# Patient Record
Sex: Female | Born: 1960 | Race: White | Hispanic: No | Smoking: Former smoker
Health system: Southern US, Community
[De-identification: ages and names within clinical notes are randomized; demographics above are authoritative.]

## PROBLEM LIST (undated history)

## (undated) DIAGNOSIS — N200 Calculus of kidney: Secondary | ICD-10-CM

## (undated) DIAGNOSIS — E079 Disorder of thyroid, unspecified: Secondary | ICD-10-CM

## (undated) HISTORY — PX: TUBAL LIGATION: SHX77

## (undated) HISTORY — PX: WISDOM TOOTH EXTRACTION: SHX21

## (undated) HISTORY — DX: Calculus of kidney: N20.0

## (undated) HISTORY — DX: Disorder of thyroid, unspecified: E07.9

---

## 2017-08-19 ENCOUNTER — Ambulatory Visit (INDEPENDENT_AMBULATORY_CARE_PROVIDER_SITE_OTHER): Payer: Managed Care, Other (non HMO) | Admitting: Orthopaedic Surgery

## 2017-08-19 ENCOUNTER — Encounter (INDEPENDENT_AMBULATORY_CARE_PROVIDER_SITE_OTHER): Payer: Self-pay | Admitting: Orthopaedic Surgery

## 2017-08-19 ENCOUNTER — Ambulatory Visit (INDEPENDENT_AMBULATORY_CARE_PROVIDER_SITE_OTHER): Payer: Managed Care, Other (non HMO)

## 2017-08-19 VITALS — BP 122/83 | HR 73 | Resp 18 | Ht 63.0 in | Wt 175.0 lb

## 2017-08-19 DIAGNOSIS — M25562 Pain in left knee: Secondary | ICD-10-CM | POA: Diagnosis not present

## 2017-08-19 NOTE — Progress Notes (Signed)
Office Visit Note   Patient: Terry Sims           Date of Birth: 1960/06/23           MRN: [id number] Visit Date: 08/19/2017              Requested by: No referring provider defined for this encounter. PCP: Alton Revere, MD   Assessment & Plan: Visit Diagnoses:  1. Acute pain of left knee     Plan: Most likely cause of knee pain is mild osteoarthritis per plain films.  Could have some other internal derangement but symptoms are more consistent with the arthritis.  Long discussion regarding different treatment options including cortisone, NSAIDs, exercises. MRI scan would be definitive diagnostic study.  Would suggest that if her pain does not resolve over the next several weeks and she is significantly.  Compromised.  Follow-Up Instructions: Return if symptoms worsen or fail to improve.   Orders:  Orders Placed This Encounter  Procedures  . XR KNEE 3 VIEW LEFT   No orders of the defined types were placed in this encounter.     Procedures: No procedures performed   Clinical Data: No additional findings.   Subjective: Chief Complaint  Patient presents with  . Left Knee - Pain  . New Patient (Initial Visit)    L knee pain for 3 months getting worse, 2 days ago pt was walking in to play volleyball and felt a snap.   Mrs. Terry Sims is 57 years old and very active playing volleyball up to 3 times a week.  She has not had any injury or trauma but several days ago had an acute onset of knee pain to the point where she could not play.  She did not have a specific event.  She was having some catching particularly along the anterior aspect of her knee.  Possibly some swelling.  No locking.  No hip or back pain.  No numbness or tingling.  Tried some heat that made a difference.  Still having some pain but much better over the last 48 hours.  HPI  Review of Systems  Constitutional: Negative for fatigue and fever.  HENT: Negative for ear pain.   Eyes: Negative for pain.    Respiratory: Negative for cough and shortness of breath.   Cardiovascular: Positive for leg swelling.  Gastrointestinal: Negative for constipation and diarrhea.  Genitourinary: Negative for difficulty urinating.  Musculoskeletal: Negative for back pain and neck pain.  Skin: Negative for rash.  Allergic/Immunologic: Negative for food allergies.  Neurological: Positive for weakness. Negative for numbness.  Hematological: Does not bruise/bleed easily.  Psychiatric/Behavioral: Negative for sleep disturbance.     Objective: Vital Signs: BP 122/83 (BP Location: Left Arm, Patient Position: Sitting, Cuff Size: Normal)   Pulse 73   Resp 18   Ht  (1.6 m)   Wt 175 lb (79.4 kg)   BMI 31.00 kg/m   Physical Exam  Constitutional: She is oriented to person, place, and time. She appears well-developed and well-nourished.  HENT:  Mouth/Throat: Oropharynx is clear and moist.  Eyes: Pupils are equal, round, and reactive to light. EOM are normal.  Pulmonary/Chest: Effort normal.  Neurological: She is alert and oriented to person, place, and time.  Skin: Skin is warm and dry.  Psychiatric: She has a normal mood and affect. Her behavior is normal.    Ortho Exam awake alert and oriented x3.  Comfortable sitting.  Left knee without effusion.  No specific medial  or lateral joint pain.  No instability.  Positive patellar crepitation with some pain with patella compression.  No skin changes.  No erythema or ecchymosis.  No popliteal pain.  No calf pain.  No distal edema.  Straight leg raise negative.  Painless range of motion hip.  Specific pain along the posterior medial posterior lateral corner.  No popping or catching other than beneath the patella  Specialty Comments:  No specialty comments available.  Imaging: Xr Knee 3 View Left  Result Date: 08/19/2017 Films of the left knee were obtained in 3 projections standing.  No ectopic calcification.  Mild tricompartmental degenerative changes  greatest spurring about the patellofemoral joint.  The joint still maintained.  Films are consistent with mild osteoarthritis.  No acute changes    PMFS History: There are no active problems to display for this patient.  Past Medical History:  Diagnosis Date  . Kidney stones   . Thyroid disease     History reviewed. No pertinent family history.  Past Surgical History:  Procedure Laterality Date  . CESAREAN SECTION    . TUBAL LIGATION    . WISDOM TOOTH EXTRACTION     Social History   Occupational History  . Not on file  Tobacco Use  . Smoking status: Former Smoker    Last attempt to quit: 1984    Years since quitting: 35.3  . Smokeless tobacco: Never Used  Substance and Sexual Activity  . Alcohol use: Not Currently  . Drug use: Not Currently  . Sexual activity: Not on file

## 2019-02-16 ENCOUNTER — Other Ambulatory Visit (HOSPITAL_COMMUNITY): Payer: Self-pay | Admitting: Urology

## 2019-02-16 DIAGNOSIS — N130 Hydronephrosis with ureteropelvic junction obstruction: Secondary | ICD-10-CM

## 2019-02-27 ENCOUNTER — Other Ambulatory Visit: Payer: Self-pay

## 2019-02-27 ENCOUNTER — Encounter (HOSPITAL_COMMUNITY)
Admission: RE | Admit: 2019-02-27 | Discharge: 2019-02-27 | Disposition: A | Discharge status: Home / Self Care | Payer: BC Managed Care – PPO | Source: Ambulatory Visit | Attending: Urology | Admitting: Urology

## 2019-02-27 DIAGNOSIS — N130 Hydronephrosis with ureteropelvic junction obstruction: Secondary | ICD-10-CM

## 2019-02-27 IMAGING — NM NM RENAL IMAGING FLOW W/ PHARM
4 series · 14 of 14 positions shown · non-contrast
Comparison: None

Imaging correlation: None

CLINICAL DATA: Hydronephrosis, UPJ obstruction, kidney stones, LEFT
flank pain

EXAM:
NUCLEAR MEDICINE RENAL SCAN WITH DIURETIC ADMINISTRATION
TECHNIQUE: Radionuclide angiographic and sequential renal images were obtained
after intravenous injection of radiopharmaceutical. Imaging was
continued during slow intravenous injection of Lasix approximately
15 minutes after the start of the examination.
RADIOPHARMACEUTICALS:  5.0 mCi Gechnetium-66m MAG3 IV
Pharmaceutical: Lasix 40 mg IV

[Series 1: renal scan · 4.14mm/px · 6 of 61 frames shown (1 of 2)]
[frame 6/61]
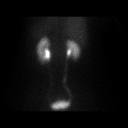
[frame 16/61]
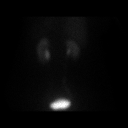
[frame 26/61]
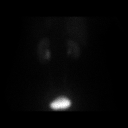
[frame 36/61]
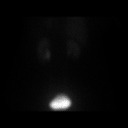
[frame 46/61]
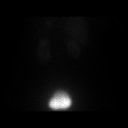
[frame 56/61]
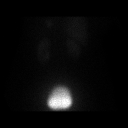

[Series 1: renal scan · 4.14mm/px · 6 of 40 frames shown (2 of 2)]
[frame 4/40  full-range]
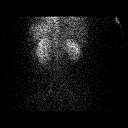
[frame 10/40  full-range]
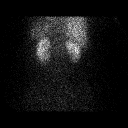
[frame 17/40  full-range]
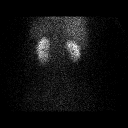
[frame 24/40  full-range]
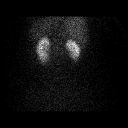
[frame 30/40  full-range]
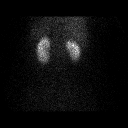
[frame 37/40  full-range]
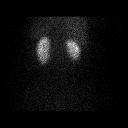

[Series 2: pre void · non-contrast · 2.07mm/px · 1 of 1 slices shown]
[im 1/1]
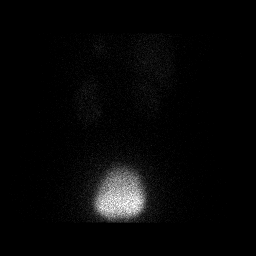

[Series 3: post void · 2.07mm/px · 1 of 1 slices shown]
[im 1/1  full-range]
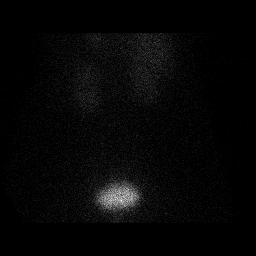

[14 of 14 positions shown; findings below may reference images not displayed]

FINDINGS: Flow: Prompt symmetric arterial flow to the kidneys. LEFT kidney
appears mildly larger than RIGHT.

Left renogram: Normal uptake, concentration and excretion of tracer
by LEFT kidney. Good washout of tracer before and continuing after
Lasix administration. No collecting system dilatation seen. No
significant residual tracer at the conclusion of the exam. Analysis
of the renogram curve demonstrates a normal time to peak activity of
3.2 minutes with a fall to half maximum activity 4.1 minutes later.

Right renogram: Normal uptake, concentration and excretion of tracer
by RIGHT kidney. Good clearance of tracer by RIGHT kidney prior to
and continuing following Lasix administration. No collecting system
dilatation seen. No significant residual tracer at conclusion of the
exam. Analysis of the renogram curve demonstrates a normal time to
peak activity of 2.7 minutes with fall to half maximum activity
minutes later.

Differential:

Left kidney = 56 %

Right kidney = 44 %

T1/2 post Lasix :

Left kidney = N/A min, tracer predominantly cleared prior to Lasix

Right kidney = N/A min, tracer predominately cleared prior to Lasix
IMPRESSION: Normal exam.

## 2019-02-27 MED ORDER — FUROSEMIDE 10 MG/ML IJ SOLN
INTRAMUSCULAR | Status: AC
  Filled 2019-02-27: qty 4

## 2019-02-27 MED ORDER — FUROSEMIDE 10 MG/ML IJ SOLN
40.0000 mg | Freq: Once | INTRAMUSCULAR | Status: AC
  Administered 2019-02-27: 40 mg via INTRAVENOUS

## 2019-02-27 MED ORDER — TECHNETIUM TC 99M MERTIATIDE: 5.0000 | Freq: Once | INTRAVENOUS | Status: DC | PRN

## 2020-12-16 LAB — TSH: TSH: 2.02 (ref 0.41–5.90)

## 2021-05-27 ENCOUNTER — Encounter: Payer: Self-pay | Admitting: Nurse Practitioner

## 2021-05-27 ENCOUNTER — Ambulatory Visit (INDEPENDENT_AMBULATORY_CARE_PROVIDER_SITE_OTHER): Payer: BLUE CROSS/BLUE SHIELD | Admitting: Nurse Practitioner

## 2021-05-27 VITALS — BP 111/76 | HR 70 | Ht 62.5 in | Wt 170.0 lb

## 2021-05-27 DIAGNOSIS — E063 Autoimmune thyroiditis: Secondary | ICD-10-CM

## 2021-05-27 DIAGNOSIS — E038 Other specified hypothyroidism: Secondary | ICD-10-CM | POA: Diagnosis not present

## 2021-05-27 MED ORDER — LEVOTHYROXINE SODIUM 112 MCG PO TABS: 112.0000 ug | ORAL_TABLET | Freq: Every day | ORAL | 0 refills | Status: DC

## 2021-05-27 NOTE — Patient Instructions (Signed)

## 2021-05-27 NOTE — Progress Notes (Signed)
Endocrinology Consult Note                                         05/27/2021, 8:54 AM  Subjective:   Subjective    Terry Sims is a 61 y.o.-year-old female patient being seen in consultation for hypothyroidism referred by Buddy Duty, NP.   Past Medical History:  Diagnosis Date   Kidney stones    Thyroid disease     Past Surgical History:  Procedure Laterality Date   CESAREAN SECTION     TUBAL LIGATION     WISDOM TOOTH EXTRACTION      Social History   Socioeconomic History   Marital status: Divorced    Spouse name: Not on file   Number of children: Not on file   Years of education: Not on file   Highest education level: Not on file  Occupational History   Not on file  Tobacco Use   Smoking status: Former    Types: Cigarettes    Quit date: 55    Years since quitting: 39.1    Passive exposure: Past   Smokeless tobacco: Never  Vaping Use   Vaping Use: Never used  Substance and Sexual Activity   Alcohol use: Not Currently   Drug use: Not Currently   Sexual activity: Not on file  Other Topics Concern   Not on file  Social History Narrative   Not on file   Social Determinants of Health   Financial Resource Strain: Not on file  Food Insecurity: Not on file  Transportation Needs: Not on file  Physical Activity: Not on file  Stress: Not on file  Social Connections: Not on file    Family History  Problem Relation Age of Onset   Cancer Mother    Hypertension Mother    Thyroid disease Mother    Osteoporosis Mother     Outpatient Encounter Medications as of 05/27/2021  Medication Sig   buPROPion (WELLBUTRIN XL) 300 MG 24 hr tablet Take 300 mg by mouth daily.   levothyroxine (SYNTHROID) 112 MCG tablet Take 1 tablet (112 mcg total) by mouth daily.   Vitamin D, Ergocalciferol, (DRISDOL) 1.25 MG (50000 UNIT) CAPS capsule Take 50,000 Units by mouth once a week.   [DISCONTINUED] thyroid  (ARMOUR) 90 MG tablet NP Thyroid 90 mg tablet  TAKE 1 TABLET BY MOUTH BEFORE OTHER MEDS OR MEALS   [DISCONTINUED] levothyroxine (SYNTHROID) 75 MCG tablet Take by mouth. (Patient not taking: Reported on 05/27/2021)   No facility-administered encounter medications on file as of 05/27/2021.    ALLERGIES: Allergies  Allergen Reactions   Codeine Nausea Only   VACCINATION STATUS:  There is no immunization history on file for this patient.   HPI   Terry Sims  is a patient with the above medical history. she was diagnosed with hypothyroidism at approximate age of 44 years, which required subsequent initiation of thyroid hormone replacement. she was given various doses of NP Thyroid and Levothyroxine over the years, currently on  90 mg of the NP thyroid. she reports compliance to this medication:  Taking it daily on empty stomach, however she does take it with her morning coffee.   I reviewed patient's thyroid tests:  Lab Results  Component Value Date   TSH 2.02 12/16/2020     Pt describes: - weight gain- reports 20+ lb weight gain over last 3 months - fatigue - depression - constipation - nail abnormality  Pt denies feeling nodules in neck, hoarseness, dysphagia/odynophagia, SOB with lying down.  she does have family history of thyroid disorders in her mom and sisters.  No family history of thyroid cancer.  No history of radiation therapy to head or neck.  No recent use of iodine supplements.  Denies use of Biotin containing supplements.  I reviewed her chart and she also has a history of depression and OA.   ROS:  Constitutional: + weight gain, + fatigue, no subjective hyperthermia, no subjective hypothermia Eyes: no blurry vision, no xerophthalmia ENT: no sore throat, no nodules palpated in throat, no dysphagia/odynophagia, no hoarseness Cardiovascular: no chest pain, no SOB, no palpitations, no leg swelling Respiratory: no cough, no SOB Gastrointestinal: no  nausea/vomiting/diarrhea, + constipation Musculoskeletal: no muscle/joint aches Skin: no rashes, + nail ridges Neurological: no tremors, no numbness, no tingling, no dizziness Psychiatric: + depression, no anxiety   Objective:   Objective     BP 111/76    Pulse 70    Ht 5' 2.5" (1.588 m)    Wt 170 lb (77.1 kg)    SpO2 97%    BMI 30.60 kg/m  Wt Readings from Last 3 Encounters:  05/27/21 170 lb (77.1 kg)  08/19/17 175 lb (79.4 kg)    BP Readings from Last 3 Encounters:  05/27/21 111/76  08/19/17 122/83     Constitutional:  Body mass index is 30.6 kg/m., not in acute distress, normal state of mind Eyes: PERRLA, EOMI, no exophthalmos ENT: moist mucous membranes, no thyromegaly, no cervical lymphadenopathy Cardiovascular: normal precordial activity, RRR, no murmur/rubs/gallops Respiratory:  adequate breathing efforts, no gross chest deformity, Clear to auscultation bilaterally Gastrointestinal: abdomen soft, non-tender, no distension, bowel sounds present Musculoskeletal: no gross deformities, strength intact in all four extremities Skin: moist, warm, no rashes, + vertical nail ridges bilaterally Neurological: mild tremor with outstretched hands, deep tendon reflexes normal in BLE.   CMP ( most recent) CMP  No results found for: NA, K, CL, CO2, GLUCOSE, BUN, CREATININE, CALCIUM, PROT, ALBUMIN, AST, ALT, ALKPHOS, BILITOT, GFRNONAA, GFRAA   Diabetic Labs (most recent): No results found for: HGBA1C   Lipid Panel ( most recent) Lipid Panel  No results found for: CHOL, TRIG, HDL, CHOLHDL, VLDL, LDLCALC, LDLDIRECT, LABVLDL     Lab Results  Component Value Date   TSH 2.02 12/16/2020      Assessment & Plan:   ASSESSMENT / PLAN:  1. Hypothyroidism- r/t Hashimoto's thyroiditis   Patient with long-standing hypothyroidism, on thyroid hormone replacement therapy with NP thyroid. On physical exam, patient  does not have gross goiter, thyroid nodules, or neck compression  symptoms.  I discussed switching patient back to synthetic hormone for better control and she agreed.  I discussed and initiated her on Levothyroxine 112 mcg po daily before breakfast.  - We discussed about correct intake of levothyroxine, at fasting, with water, separated by at least 30 minutes from breakfast, and separated by more than 4 hours from calcium, iron, multivitamins, acid reflux medications (PPIs). -Patient is made aware of the fact that  thyroid hormone replacement is needed for life, dose to be adjusted by periodic monitoring of thyroid function tests.  - Will check thyroid tests today for baseline: TSH, Free T4, Free T3, and will repeat TSH and FT4 prior to next appt to check status after switching to synthetic hormone.  -Due to absence of clinical goiter, no need for thyroid ultrasound.  -She also mentioned wanting her reproductive hormones checked, I encouraged her to reach out to her OBGYN for this testing as this is their area of expertise.  - Time spent with the patient: 52 minutes, of which >50% was spent in obtaining information about her symptoms, reviewing her previous labs, evaluations, and treatments, counseling her about her hypothyroidism, and developing a plan to confirm the diagnosis and long term treatment as necessary. Please refer to "Patient Self Inventory" in the Media tab for reviewed elements of pertinent patient history.  Sarann O'Dell participated in the discussions, expressed understanding, and voiced agreement with the above plans.  All questions were answered to her satisfaction. she is encouraged to contact clinic should she have any questions or concerns prior to her return visit.   FOLLOW UP PLAN:  Return in about 2 months (around 07/25/2021) for Thyroid follow up, Previsit labs- labs today and again before next visit in 8 weeks.  Ronny Bacon, [facility] [address] [place], [place] [address] Phone:  [phone] Fax: [phone]  05/27/2021, 8:54 AM

## 2021-05-28 LAB — T4, FREE: Free T4: 0.83 ng/dL (ref 0.82–1.77)

## 2021-05-28 LAB — T3, FREE: T3, Free: 2.9 pg/mL (ref 2.0–4.4)

## 2021-05-28 LAB — TSH: TSH: 0.637 u[IU]/mL (ref 0.450–4.500)

## 2021-07-17 LAB — TSH: TSH: 0.055 u[IU]/mL — ABNORMAL LOW (ref 0.450–4.500)

## 2021-07-17 LAB — T4, FREE: Free T4: 1.77 ng/dL (ref 0.82–1.77)

## 2021-07-25 ENCOUNTER — Ambulatory Visit: Payer: BLUE CROSS/BLUE SHIELD | Admitting: Nurse Practitioner

## 2021-07-29 NOTE — Patient Instructions (Signed)

## 2021-07-30 ENCOUNTER — Encounter: Payer: Self-pay | Admitting: Nurse Practitioner

## 2021-07-30 ENCOUNTER — Ambulatory Visit (INDEPENDENT_AMBULATORY_CARE_PROVIDER_SITE_OTHER): Payer: BLUE CROSS/BLUE SHIELD | Admitting: Nurse Practitioner

## 2021-07-30 VITALS — BP 92/66 | HR 63 | Ht 62.5 in | Wt 172.0 lb

## 2021-07-30 DIAGNOSIS — E063 Autoimmune thyroiditis: Secondary | ICD-10-CM | POA: Diagnosis not present

## 2021-07-30 DIAGNOSIS — E038 Other specified hypothyroidism: Secondary | ICD-10-CM

## 2021-07-30 MED ORDER — LEVOTHYROXINE SODIUM 112 MCG PO TABS: 112.0000 ug | ORAL_TABLET | Freq: Every day | ORAL | 1 refills | Status: AC

## 2021-07-30 MED ORDER — BUPROPION HCL ER (XL) 300 MG PO TB24: 300.0000 mg | ORAL_TABLET | Freq: Every day | ORAL | 3 refills | Status: AC

## 2021-07-30 NOTE — Progress Notes (Signed)
?                                   ?                                Endocrinology Follow Up Note  ?                                       07/30/2021, 9:07 AM ? ?Subjective:  ? ?Subjective   ? ?Terry Sims is a 61 y.o.-year-old female patient being seen in follow up after being seen in consultation for hypothyroidism referred by Buddy Duty, NP. ? ? ?Past Medical History:  ?Diagnosis Date  ? Kidney stones   ? Thyroid disease   ? ? ?Past Surgical History:  ?Procedure Laterality Date  ? CESAREAN SECTION    ? TUBAL LIGATION    ? WISDOM TOOTH EXTRACTION    ? ? ?Social History  ? ?Socioeconomic History  ? Marital status: Divorced  ?  Spouse name: Not on file  ? Number of children: Not on file  ? Years of education: Not on file  ? Highest education level: Not on file  ?Occupational History  ? Not on file  ?Tobacco Use  ? Smoking status: Former  ?  Types: Cigarettes  ?  Quit date: 47  ?  Years since quitting: 39.3  ?  Passive exposure: Past  ? Smokeless tobacco: Never  ?Vaping Use  ? Vaping Use: Never used  ?Substance and Sexual Activity  ? Alcohol use: Not Currently  ? Drug use: Not Currently  ? Sexual activity: Not on file  ?Other Topics Concern  ? Not on file  ?Social History Narrative  ? Not on file  ? ?Social Determinants of Health  ? ?Financial Resource Strain: Not on file  ?Food Insecurity: Not on file  ?Transportation Needs: Not on file  ?Physical Activity: Not on file  ?Stress: Not on file  ?Social Connections: Not on file  ? ? ?Family History  ?Problem Relation Age of Onset  ? Cancer Mother   ? Hypertension Mother   ? Thyroid disease Mother   ? Osteoporosis Mother   ? ? ?Outpatient Encounter Medications as of 07/30/2021  ?Medication Sig  ? Vitamin D, Ergocalciferol, (DRISDOL) 1.25 MG (50000 UNIT) CAPS capsule Take 50,000 Units by mouth once a week.  ? [DISCONTINUED] buPROPion (WELLBUTRIN XL) 300 MG 24 hr tablet Take 300 mg by mouth daily.  ? [DISCONTINUED] levothyroxine (SYNTHROID) 112 MCG tablet Take 1  tablet (112 mcg total) by mouth daily.  ? buPROPion (WELLBUTRIN XL) 300 MG 24 hr tablet Take 1 tablet (300 mg total) by mouth daily.  ? levothyroxine (SYNTHROID) 112 MCG tablet Take 1 tablet (112 mcg total) by mouth daily.  ? ?No facility-administered encounter medications on file as of 07/30/2021.  ? ? ?ALLERGIES: ?Allergies  ?Allergen Reactions  ? Codeine Nausea Only  ? ?VACCINATION STATUS: ? ?There is no immunization history on file for this patient. ? ? ?HPI  ? ?Terry Sims  is a patient with the above medical history. she was diagnosed with hypothyroidism at approximate age of 69 years, which required subsequent initiation of thyroid hormone replacement. she was given various doses of NP Thyroid and Levothyroxine over the years,  currently on Levothyroxine 112 mcg po daily before breakfast. she reports compliance to this medication:  Taking it daily on empty stomach. ? ?I reviewed patient's thyroid tests:  ?Lab Results  ?Component Value Date  ? TSH 0.055 (L) 07/16/2021  ? TSH 0.637 05/27/2021  ? TSH 2.02 12/16/2020  ? FREET4 1.77 07/16/2021  ? FREET4 0.83 05/27/2021  ?  ? ?Pt denies feeling nodules in neck, hoarseness, dysphagia/odynophagia, SOB with lying down. ? ?she does have family history of thyroid disorders in her mom and sisters.  No family history of thyroid cancer.  ?No history of radiation therapy to head or neck.  No recent use of iodine supplements.  Denies use of Biotin containing supplements. ? ?I reviewed her chart and she also has a history of depression and OA. ? ? ?Review of systems ? ?Constitutional: + Minimally fluctuating body weight,  current Body mass index is 30.96 kg/m?. , + fatigue, no subjective hyperthermia, no subjective hypothermia ?Eyes: no blurry vision, no xerophthalmia ?ENT: no sore throat, no nodules palpated in throat, no dysphagia/odynophagia, no hoarseness ?Cardiovascular: no chest pain, no shortness of breath, no palpitations, no leg swelling ?Respiratory: no cough, no  shortness of breath ?Gastrointestinal: no nausea/vomiting/diarrhea, intermittent constipation ?Musculoskeletal: no muscle/joint aches ?Skin: no rashes, no hyperemia ?Neurological: no tremors, no numbness, no tingling, no dizziness ?Psychiatric: no depression, no anxiety ? ? ?Objective:  ? ?Objective   ? ? ?BP 92/66   Pulse 63   Ht 5' 2.5" (1.588 m)   Wt 172 lb (78 kg)   SpO2 98%   BMI 30.96 kg/m?  ?Wt Readings from Last 3 Encounters:  ?07/30/21 172 lb (78 kg)  ?05/27/21 170 lb (77.1 kg)  ?08/19/17 175 lb (79.4 kg)  ? ? ?BP Readings from Last 3 Encounters:  ?07/30/21 92/66  ?05/27/21 111/76  ?08/19/17 122/83  ?  ? ? ?Physical Exam- Limited ? ?Constitutional:  Body mass index is 30.96 kg/m?. , not in acute distress, normal state of mind ?Eyes:  EOMI, no exophthalmos ?Neck: Supple ?Cardiovascular: RRR, no murmurs, rubs, or gallops, no edema ?Respiratory: Adequate breathing efforts, no crackles, rales, rhonchi, or wheezing ?Musculoskeletal: no gross deformities, strength intact in all four extremities, no gross restriction of joint movements ?Skin:  no rashes, no hyperemia ?Neurological: no tremor with outstretched hands ? ? ?CMP ( most recent) ?CMP  ?No results found for: NA, K, CL, CO2, GLUCOSE, BUN, CREATININE, CALCIUM, PROT, ALBUMIN, AST, ALT, ALKPHOS, BILITOT, GFRNONAA, GFRAA ? ? ?Diabetic Labs (most recent): ?No results found for: HGBA1C ? ? Lipid Panel ( most recent) ?Lipid Panel  ?No results found for: CHOL, TRIG, HDL, CHOLHDL, VLDL, LDLCALC, LDLDIRECT, LABVLDL ?  ? ? ?Lab Results  ?Component Value Date  ? TSH 0.055 (L) 07/16/2021  ? TSH 0.637 05/27/2021  ? TSH 2.02 12/16/2020  ? FREET4 1.77 07/16/2021  ? FREET4 0.83 05/27/2021  ?  ? ? ?Assessment & Plan:  ? ?ASSESSMENT / PLAN: ? ?1. Hypothyroidism- r/t Hashimoto's thyroiditis ? ? Patient with long-standing hypothyroidism, on thyroid hormone replacement therapy with NP thyroid. On physical exam, patient  does not have gross goiter, thyroid nodules, or neck  compression symptoms. ? ?Her previsit thyroid function tests are consistent with borderline too much thyroid hormone.  Will keep her at same dose of Levothyroxine 112 mcg po daily before breakfast for now.  I did discuss s/s of hyper and hypothyroidism to look out for.  ? ?- We discussed about correct intake of levothyroxine, at fasting, with  water, separated by at least 30 minutes from breakfast, and separated by more than 4 hours from calcium, iron, multivitamins, acid reflux medications (PPIs). ?-Patient is made aware of the fact that thyroid hormone replacement is needed for life, dose to be adjusted by periodic monitoring of thyroid function tests. ? ?- Will check thyroid tests today for baseline: TSH, Free T4, Free T3, and will repeat TSH and FT4 prior to next appt to check status after switching to synthetic hormone. ? ? ? ? ?I spent 22 minutes in the care of the patient today including review of labs from Thyroid Function, CMP, and other relevant labs ; imaging/biopsy records (current and previous including abstractions from other facilities); face-to-face time discussing  her lab results and symptoms, medications doses, her options of short and long term treatment based on the latest standards of care / guidelines;   and documenting the encounter. ? ?Coralie CarpenBarbara O'Dell  participated in the discussions, expressed understanding, and voiced agreement with the above plans.  All questions were answered to her satisfaction. she is encouraged to contact clinic should she have any questions or concerns prior to her return visit. ? ? ?FOLLOW UP PLAN: ? ?Return in about 4 months (around 11/29/2021) for Thyroid follow up, Previsit labs. ? ?Ronny BaconWhitney Crystale Giannattasio, FNP-BC ?[facility] ?[address] ?[place], [place] [address] ?Phone: [phone] ?Fax: [phone] ? ?07/30/2021, 9:07 AM ? ?

## 2021-12-01 ENCOUNTER — Ambulatory Visit: Payer: BLUE CROSS/BLUE SHIELD | Admitting: Nurse Practitioner
